# Patient Record
Sex: Male | Born: 1970 | Race: White | Hispanic: No | Marital: Single | Smoking: Former smoker
Health system: Southern US, Community
[De-identification: ages and names within clinical notes are randomized; demographics above are authoritative.]

## PROBLEM LIST (undated history)

## (undated) HISTORY — PX: OTHER SURGICAL HISTORY: SHX169

---

## 2010-12-30 ENCOUNTER — Encounter: Payer: Self-pay | Admitting: *Deleted

## 2010-12-30 ENCOUNTER — Emergency Department (HOSPITAL_COMMUNITY)
Admission: EM | Admit: 2010-12-30 | Discharge: 2010-12-30 | Disposition: A | Discharge status: Home / Self Care | Payer: Self-pay | Attending: Emergency Medicine | Admitting: Emergency Medicine

## 2010-12-30 DIAGNOSIS — Z202 Contact with and (suspected) exposure to infections with a predominantly sexual mode of transmission: Secondary | ICD-10-CM | POA: Insufficient documentation

## 2010-12-30 DIAGNOSIS — Z87891 Personal history of nicotine dependence: Secondary | ICD-10-CM | POA: Insufficient documentation

## 2010-12-30 MED ORDER — AZITHROMYCIN 250 MG PO TABS
1000.0000 mg | ORAL_TABLET | Freq: Once | ORAL | Status: AC
  Administered 2010-12-30: 1000 mg via ORAL
  Filled 2010-12-30: qty 4

## 2010-12-30 MED ORDER — CEFTRIAXONE SODIUM 250 MG IJ SOLR
250.0000 mg | Freq: Once | INTRAMUSCULAR | Status: AC
  Administered 2010-12-30: 250 mg via INTRAMUSCULAR
  Filled 2010-12-30: qty 250

## 2010-12-30 MED ORDER — LIDOCAINE HCL (PF) 1 % IJ SOLN
INTRAMUSCULAR | Status: AC
  Administered 2010-12-30: 14:00:00
  Filled 2010-12-30: qty 5

## 2010-12-30 NOTE — ED Notes (Signed)
Pt was notified by his girlfriend that he needed to come and get checked and treatment for possible STD.

## 2011-01-06 NOTE — ED Provider Notes (Signed)
Medical screening examination/treatment/procedure(s) were performed by non-physician practitioner and as supervising physician I was immediately available for consultation/collaboration.   Lyanne Co, MD 01/06/11 [phone]
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
## 2020-10-01 ENCOUNTER — Emergency Department (HOSPITAL_COMMUNITY): Payer: Self-pay

## 2020-10-01 ENCOUNTER — Emergency Department (HOSPITAL_COMMUNITY)
Admission: EM | Admit: 2020-10-01 | Discharge: 2020-10-01 | Disposition: A | Discharge status: Home / Self Care | Payer: Self-pay | Attending: Emergency Medicine | Admitting: Emergency Medicine

## 2020-10-01 ENCOUNTER — Encounter (HOSPITAL_COMMUNITY): Payer: Self-pay | Admitting: *Deleted

## 2020-10-01 DIAGNOSIS — R109 Unspecified abdominal pain: Secondary | ICD-10-CM | POA: Insufficient documentation

## 2020-10-01 DIAGNOSIS — Z87891 Personal history of nicotine dependence: Secondary | ICD-10-CM | POA: Insufficient documentation

## 2020-10-01 DIAGNOSIS — R9389 Abnormal findings on diagnostic imaging of other specified body structures: Secondary | ICD-10-CM

## 2020-10-01 DIAGNOSIS — R935 Abnormal findings on diagnostic imaging of other abdominal regions, including retroperitoneum: Secondary | ICD-10-CM | POA: Insufficient documentation

## 2020-10-01 DIAGNOSIS — R000 Tachycardia, unspecified: Secondary | ICD-10-CM | POA: Insufficient documentation

## 2020-10-01 NOTE — ED Notes (Signed)
Patient transported to CT 

## 2020-10-01 NOTE — ED Provider Notes (Signed)
[facility] EMERGENCY DEPARTMENT Provider Note   CSN: [id number] Arrival date & time: 10/01/20  1219     History No chief complaint on file.   Randy Bryan is a 50 y.o. male resents [facility] the [facility] jail with concern for possible foreign body.  Patient was just released from jail approximately 1 week ago and is going back in.  He is escorted by police at this time.  They did a screening full body x-ray and noticed a possible foreign body in patient's abdomen.  He denies placing anything inside his rectum.  He has no complaints at this time.  He denies any abdominal pain.   The history is provided by the patient, medical records and the police.       History reviewed. No pertinent past medical history.  There are no problems to display for this patient.   Past Surgical History:  Procedure Laterality Date  . rt leg surgery     from [facility]       No family history on file.  Social History   Tobacco Use  . Smoking status: Former Games developer  . Smokeless tobacco: Never Used  Substance Use Topics  . Alcohol use: Yes    Comment: drinks 4-5 40 oz beers per day  . Drug use: No    Home Medications Prior to Admission medications   Not on File    Allergies    Patient has no known allergies.  Review of Systems   Review of Systems  Constitutional: Negative for chills and fever.  Gastrointestinal: Negative for abdominal pain, constipation, nausea, rectal pain and vomiting.  All other systems reviewed and are negative.   Physical Exam Updated Vital Signs BP (!) 114/92   Pulse (!) 106   Temp 98.1 F (36.7 C) (Oral)   Resp 16   SpO2 100%   Physical Exam Vitals and nursing note reviewed.  Constitutional:      Appearance: He is not ill-appearing.  HENT:     Head: Normocephalic and atraumatic.  Eyes:     Conjunctiva/sclera: Conjunctivae normal.  Cardiovascular:     Rate and Rhythm: Normal rate and regular rhythm.     Pulses: Normal pulses.  Pulmonary:      Effort: Pulmonary effort is normal.     Breath sounds: Normal breath sounds. No wheezing, rhonchi or rales.  Abdominal:     Tenderness: There is no abdominal tenderness. There is no guarding or rebound.  Skin:    General: Skin is warm and dry.     Coloration: Skin is not jaundiced.  Neurological:     Mental Status: He is alert.     ED Results / Procedures / Treatments   Labs (all labs ordered are listed, but only abnormal results are displayed) Labs Reviewed - No data to display  EKG None  Radiology CT PELVIS WO CONTRAST  Result Date: 10/01/2020 CLINICAL DATA:  Evaluate for possible rectal foreign body (drug paraphernalia). EXAM: CT PELVIS WITHOUT CONTRAST TECHNIQUE: Multidetector CT imaging of the pelvis was performed following the standard protocol without intravenous contrast. COMPARISON:  None. FINDINGS: Urinary Tract: The bladder is unremarkable. No bladder mass or calculi. No asymmetric bladder wall thickening. Bowel: The rectum, sigmoid colon and visualized small bowel loops are unremarkable. No acute inflammatory changes, mass lesions or obstructive findings. No radiopaque foreign body is identified. Vascular/Lymphatic: The major vascular structures are unremarkable. No lymphadenopathy. Reproductive: The prostate gland and seminal vesicles are unremarkable. Penile calcifications may  be related to prior trauma or infection. Other: No pelvic mass or adenopathy. No free pelvic fluid collections. No inguinal mass or adenopathy. No abdominal wall hernia or subcutaneous lesions. Musculoskeletal: No significant bony findings. IMPRESSION: 1. No rectal radiopaque foreign body is identified. 2. No acute pelvic findings, mass lesions or adenopathy. Electronically Signed   By: Rudie Meyer M.D.   On: 10/01/2020 15:16   DG Abd 2 Views  Addendum Date: 10/01/2020   ADDENDUM REPORT: 10/01/2020 13:47 ADDENDUM: Upon further review, 3 rounded air collections are noted in the pelvis. The possibility  of these representing nonmetallic encapsulated foreign bodies in the rectum cannot be excluded. CT scan may be performed for further evaluation. These results were discussed by telephone at the time of interpretation on 10/01/2020 at 1:47 pm with provider Rathana Viveros , who verbally acknowledged these results. Electronically Signed   By: Lupita Raider M.D.   On: 10/01/2020 13:47   Result Date: 10/01/2020 CLINICAL DATA:  Possible foreign body. EXAM: ABDOMEN - 2 VIEW COMPARISON:  None. FINDINGS: The bowel gas pattern is normal. There is no evidence of free air. No radio-opaque calculi or other significant radiographic abnormality is seen. No definite radiopaque foreign body is noted. IMPRESSION: No definite radiopaque foreign body is noted. Electronically Signed: By: Lupita Raider M.D. On: 10/01/2020 13:31    Procedures Procedures   Medications Ordered in ED Medications - No data to display  ED Course  I have reviewed the triage vital signs and the nursing notes.  Pertinent labs & imaging results that were available during my care of the patient were reviewed by me and considered in my medical decision making (see chart for details).    MDM Rules/Calculators/A&P                          50 year old male in police custody who presents to the ED today with questionable foreign body from full body x-ray done at intake from the jail.  He denies placing anything in his rectum and has no complaints at this time.  On arrival to the ED vitals are stable.  He appears to be in no acute distress.  He was initially tachycardic however this is dissipated while in the room.  We will plan for abdominal x-ray at this time for further evaluation and may consider CT scan.  Radiology initial read of x-ray no foreign body appreciated.  Per my evaluation of the x-ray patient does appear to have 3 circular questionable foreign bodies in the rectum.  Discussed case with radiologist Dr. Chilton Si and upon further  evaluation does report that these could be foreign bodies however difficult to assess at this time.  He has placed an addendum in his chart.  He does recommend CT pelvis without contrast for further evaluation and visualization of where these foreign bodies are located.   CT scan without any acute findings. Pt discharged back to jail at this time.   This note was prepared using Dragon voice recognition software and may include unintentional dictation errors due to the inherent limitations of voice recognition software.  Final Clinical Impression(s) / ED Diagnoses Final diagnoses:  Abdominal pain  Abnormal x-ray    Rx / DC Orders ED Discharge Orders    None       Discharge Instructions     The CT scan did not show any foreign bodies in pelvis. The abnormality seen on the initial full body scan  was likely a bowel gas pattern. Patient has been medically cleared from our facility. Attached is the CT scan report.        Tanda Rockers, PA-C 10/01/20 1542    Pricilla Loveless, MD 10/02/20 [phone]

## 2020-10-01 NOTE — Discharge Instructions (Addendum)
The CT scan did not show any foreign bodies in pelvis. The abnormality seen on the initial full body scan was likely a bowel gas pattern. Patient has been medically cleared from our facility. Attached is the CT scan report.

## 2020-10-01 NOTE — ED Triage Notes (Signed)
Brought in for possible foreign body clearance after scan at jail revealed possible object in pelvic area

## 2021-11-30 ENCOUNTER — Encounter (HOSPITAL_COMMUNITY): Payer: Self-pay

## 2021-11-30 ENCOUNTER — Emergency Department (HOSPITAL_COMMUNITY)
Admission: EM | Admit: 2021-11-30 | Discharge: 2021-11-30 | Disposition: A | Discharge status: Home / Self Care | Payer: Self-pay | Attending: Emergency Medicine | Admitting: Emergency Medicine

## 2021-11-30 ENCOUNTER — Other Ambulatory Visit: Payer: Self-pay

## 2021-11-30 ENCOUNTER — Emergency Department (HOSPITAL_COMMUNITY): Payer: Self-pay

## 2021-11-30 DIAGNOSIS — L0201 Cutaneous abscess of face: Secondary | ICD-10-CM | POA: Insufficient documentation

## 2021-11-30 DIAGNOSIS — L0291 Cutaneous abscess, unspecified: Secondary | ICD-10-CM

## 2021-11-30 MED ORDER — DOXYCYCLINE HYCLATE 100 MG PO CAPS: 100.0000 mg | ORAL_CAPSULE | Freq: Two times a day (BID) | ORAL | 0 refills | Status: AC

## 2021-11-30 MED ORDER — VANCOMYCIN HCL IN DEXTROSE 1-5 GM/200ML-% IV SOLN
1000.0000 mg | Freq: Once | INTRAVENOUS | Status: AC
  Administered 2021-11-30: 1000 mg via INTRAVENOUS
  Filled 2021-11-30: qty 200

## 2021-11-30 NOTE — Discharge Instructions (Signed)
I recommend soaking your skin and affected areas 4 times daily for 20 minutes in warm Epsom salt water to facilitate continued drainage.  Rinse completely and then dry the skin.  Keep these wounds covered in between these soaking treatments.  You may need to use a washcloth for the forehead abscess.  Take the entire course of the antibiotics prescribed.  Return here if you develop any spreading redness or worsening swelling, pain or other symptoms.  It will probably take about 24 hours before you start seeing significant improvement in the sites.

## 2021-11-30 NOTE — ED Notes (Signed)
Pt has open wound with yellow thick drainage, irregular shaped to medial right lower extremity. States started about one week ago. Denies injury. Denies ever having MRSA. States another "popped" up to left temple 3 days ago. Swelling/redness to both area. Open area to temple as well with yellow drainage. Swelling noted into left eyelids as well.

## 2021-11-30 NOTE — ED Triage Notes (Signed)
Pt has wound to L forehead and wound to L lower leg that is open with reported purulent drainage.
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
## 2022-05-12 IMAGING — CT CT PELVIS W/O CM
2 of 3 series · 16 of 46 positions shown, 18 images · non-contrast
Comparison: None.

CLINICAL DATA: Evaluate for possible rectal foreign body (drug
paraphernalia).

EXAM:
CT PELVIS WITHOUT CONTRAST
TECHNIQUE: Multidetector CT imaging of the pelvis was performed following the
standard protocol without intravenous contrast.

[Series 5: 3 axial soft · axial · 0.90mm/px · z∈[+734,+988]mm · 13 of 147 slices shown, 15 images]
[im 10/147  soft-tissue]
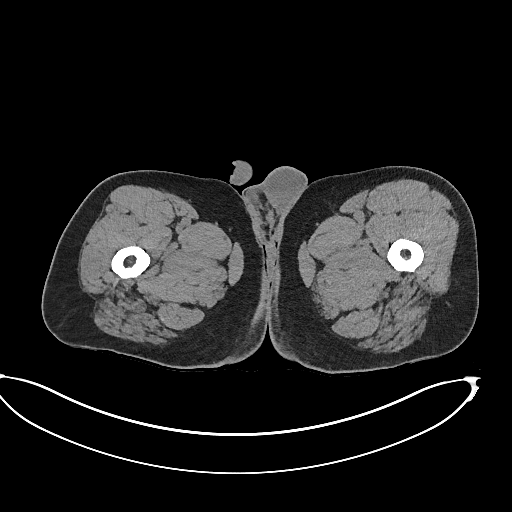
[im 10/147  bone]
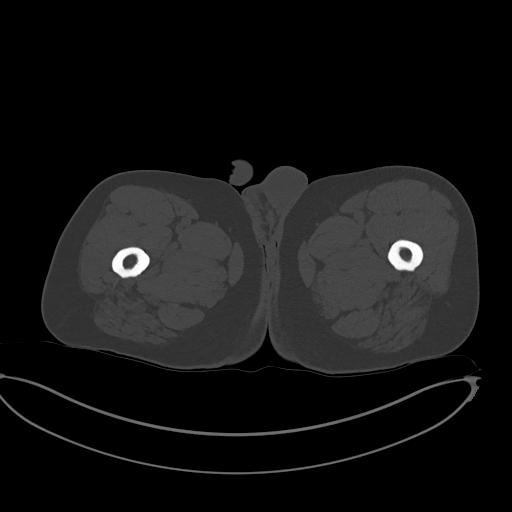
[im 19/147  soft-tissue]
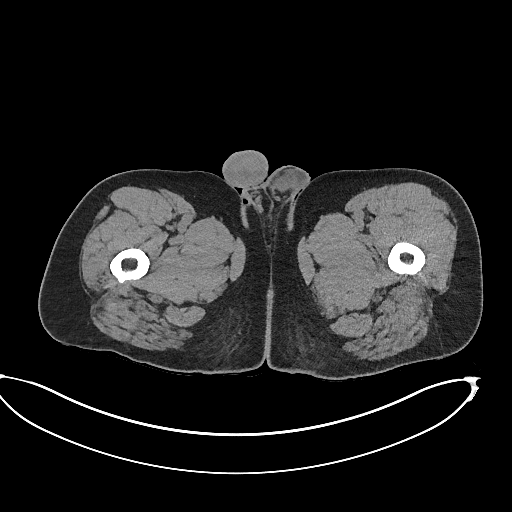
[im 29/147  soft-tissue]
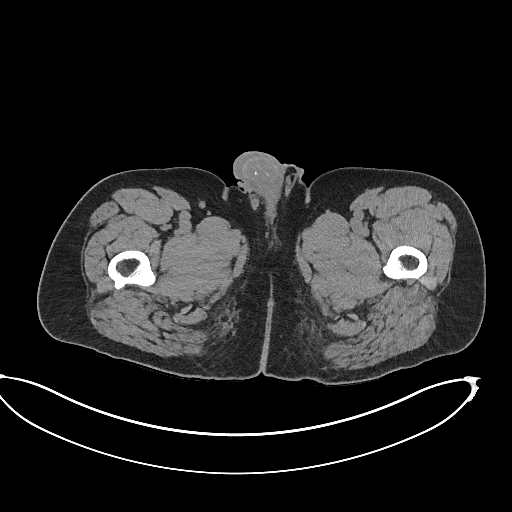
[im 43/147  soft-tissue]
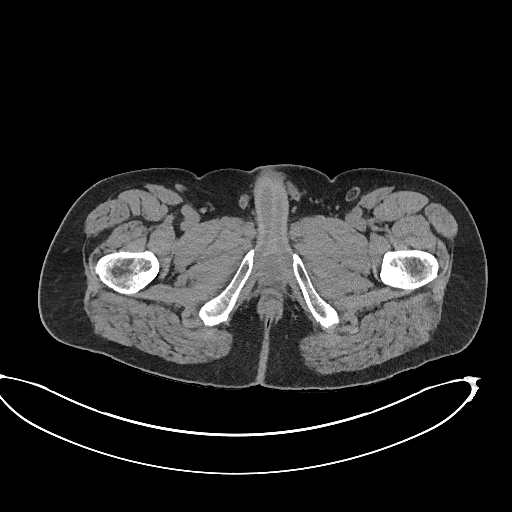
[im 52/147  soft-tissue]
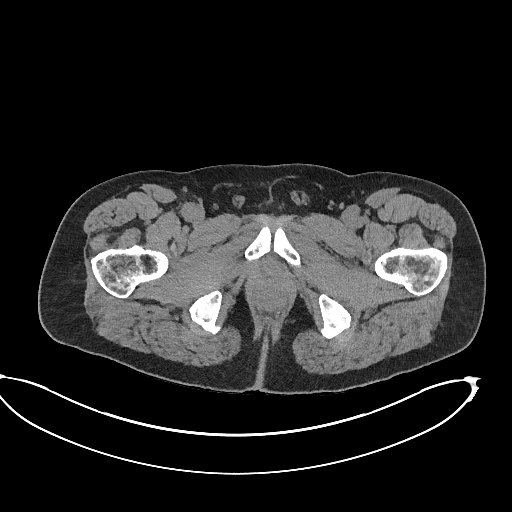
[im 62/147  soft-tissue]
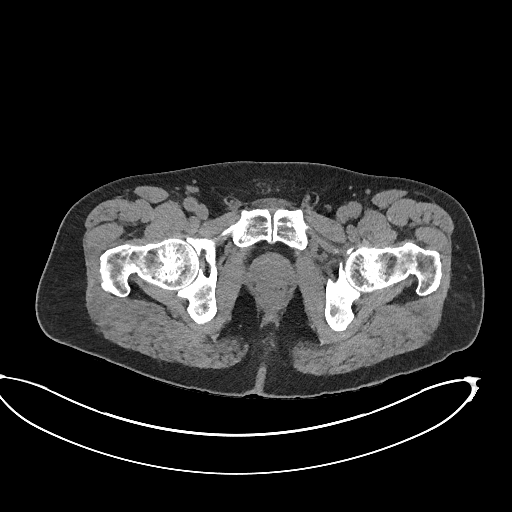
[im 76/147  soft-tissue]
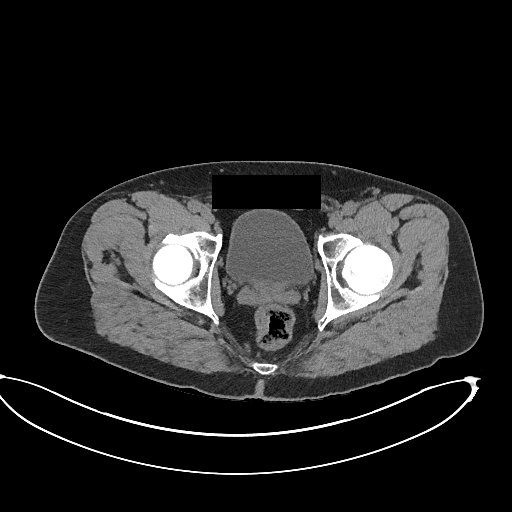
[im 85/147  soft-tissue]
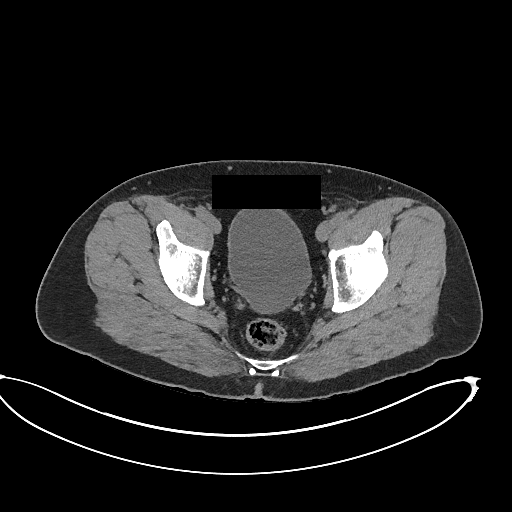
[im 95/147  soft-tissue]
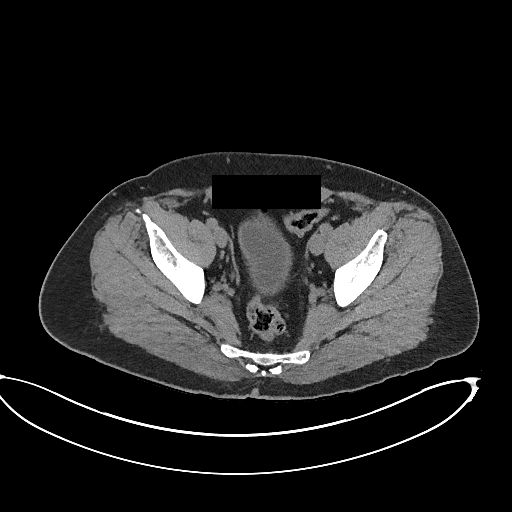
[im 95/147  bone]
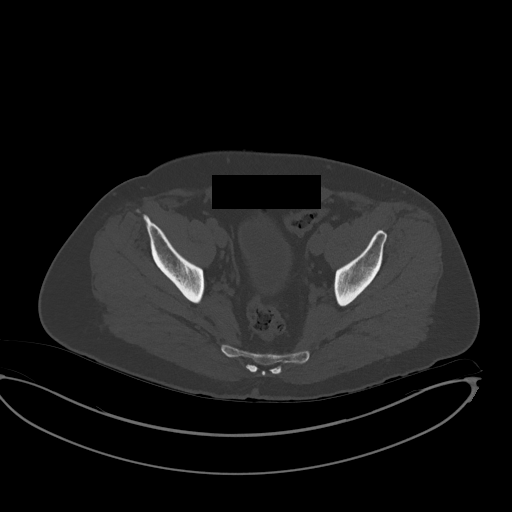
[im 104/147  soft-tissue]
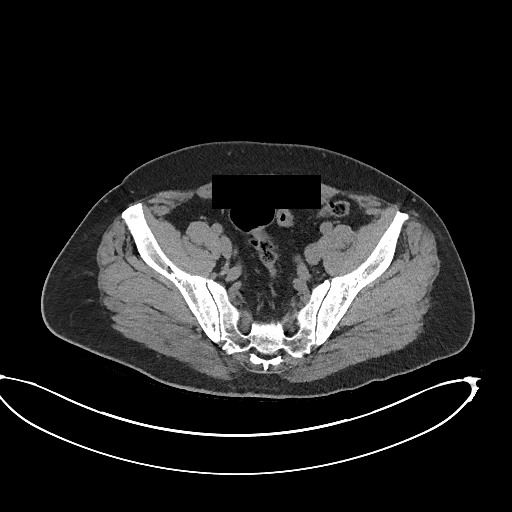
[im 118/147  soft-tissue]
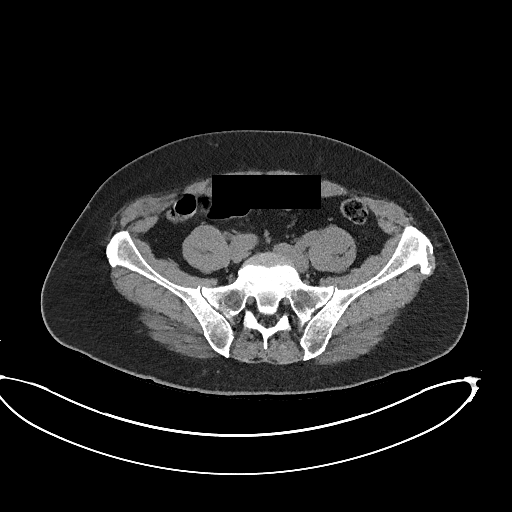
[im 128/147  soft-tissue]
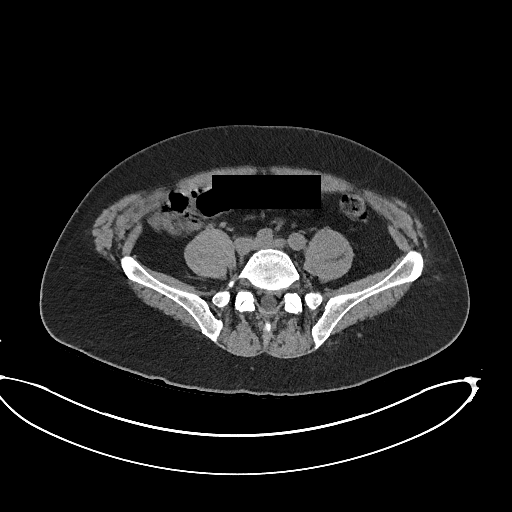
[im 137/147  soft-tissue]
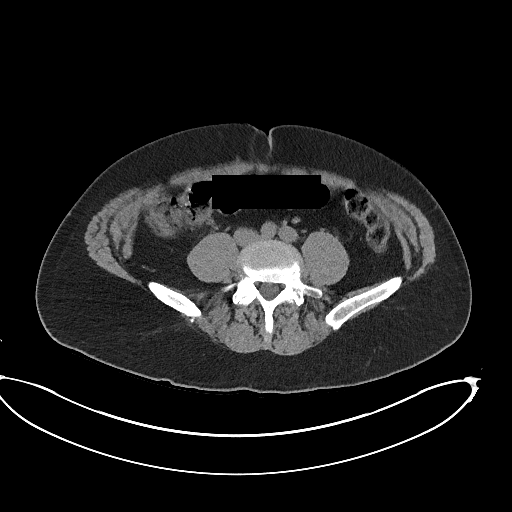

[Series 7: coronal st · coronal · 0.58mm/px · 3 of 127 slices shown]
[im 43/127  soft-tissue]
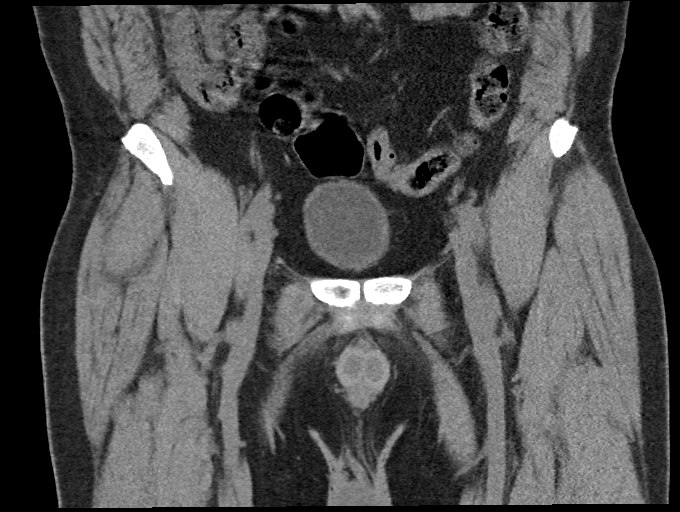
[im 57/127  soft-tissue]
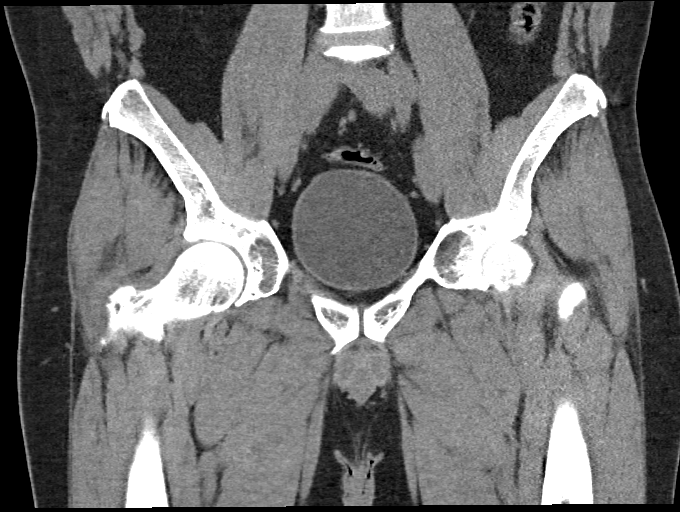
[im 71/127  soft-tissue]
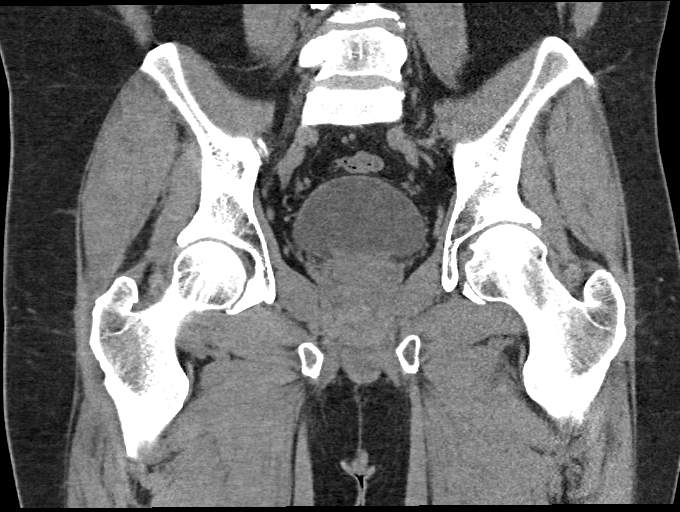

[16 of 46 positions shown; findings below may reference images not displayed]

FINDINGS: Urinary Tract: The bladder is unremarkable. No bladder mass or
calculi. No asymmetric bladder wall thickening.

Bowel: The rectum, sigmoid colon and visualized small bowel loops
are unremarkable. No acute inflammatory changes, mass lesions or
obstructive findings. No radiopaque foreign body is identified.

Vascular/Lymphatic: The major vascular structures are unremarkable.
No lymphadenopathy.

Reproductive: The prostate gland and seminal vesicles are
unremarkable. Penile calcifications may be related to prior trauma
or infection.

Other: No pelvic mass or adenopathy. No free pelvic fluid
collections. No inguinal mass or adenopathy. No abdominal wall
hernia or subcutaneous lesions.

Musculoskeletal: No significant bony findings.
IMPRESSION: 1. No rectal radiopaque foreign body is identified.
2. No acute pelvic findings, mass lesions or adenopathy.
